# Patient Record
Sex: Female | Born: 1965 | Race: White | Hispanic: No | Smoking: Never smoker
Health system: Southern US, Community
[De-identification: ages and names within clinical notes are randomized; demographics above are authoritative.]

## PROBLEM LIST (undated history)

## (undated) DIAGNOSIS — G2581 Restless legs syndrome: Secondary | ICD-10-CM

## (undated) HISTORY — PX: ABDOMINAL HYSTERECTOMY: SHX81

## (undated) HISTORY — PX: APPENDECTOMY: SHX54

---

## 2008-11-24 ENCOUNTER — Emergency Department (HOSPITAL_BASED_OUTPATIENT_CLINIC_OR_DEPARTMENT_OTHER): Admission: EM | Admit: 2008-11-24 | Discharge: 2008-11-25 | Payer: Self-pay | Admitting: Emergency Medicine

## 2010-02-03 ENCOUNTER — Emergency Department (HOSPITAL_BASED_OUTPATIENT_CLINIC_OR_DEPARTMENT_OTHER): Admission: EM | Admit: 2010-02-03 | Discharge: 2010-02-03 | Payer: Self-pay | Admitting: Emergency Medicine

## 2010-02-03 ENCOUNTER — Ambulatory Visit: Payer: Self-pay | Admitting: Diagnostic Radiology

## 2011-02-12 LAB — CBC
HCT: 29.4 % — ABNORMAL LOW (ref 36.0–46.0)
MCHC: 30.5 g/dL (ref 30.0–36.0)
RBC: 4.4 MIL/uL (ref 3.87–5.11)

## 2011-02-12 LAB — PREGNANCY, URINE: Preg Test, Ur: NEGATIVE

## 2011-02-12 LAB — GC/CHLAMYDIA PROBE AMP, GENITAL: Chlamydia, DNA Probe: NEGATIVE

## 2011-02-12 LAB — WET PREP, GENITAL
Clue Cells Wet Prep HPF POC: NONE SEEN
WBC, Wet Prep HPF POC: NONE SEEN

## 2011-02-12 LAB — RPR: RPR Ser Ql: NONREACTIVE

## 2011-10-06 ENCOUNTER — Encounter: Payer: Self-pay | Admitting: *Deleted

## 2011-10-06 ENCOUNTER — Other Ambulatory Visit: Payer: Self-pay

## 2011-10-06 ENCOUNTER — Emergency Department (INDEPENDENT_AMBULATORY_CARE_PROVIDER_SITE_OTHER): Payer: Federal, State, Local not specified - PPO

## 2011-10-06 ENCOUNTER — Emergency Department (HOSPITAL_BASED_OUTPATIENT_CLINIC_OR_DEPARTMENT_OTHER)
Admission: EM | Admit: 2011-10-06 | Discharge: 2011-10-06 | Disposition: A | Discharge status: Home / Self Care | Payer: Federal, State, Local not specified - PPO | Attending: Emergency Medicine | Admitting: Emergency Medicine

## 2011-10-06 DIAGNOSIS — R5381 Other malaise: Secondary | ICD-10-CM | POA: Insufficient documentation

## 2011-10-06 DIAGNOSIS — R4789 Other speech disturbances: Secondary | ICD-10-CM

## 2011-10-06 DIAGNOSIS — R4701 Aphasia: Secondary | ICD-10-CM | POA: Insufficient documentation

## 2011-10-06 DIAGNOSIS — F17200 Nicotine dependence, unspecified, uncomplicated: Secondary | ICD-10-CM | POA: Insufficient documentation

## 2011-10-06 DIAGNOSIS — I10 Essential (primary) hypertension: Secondary | ICD-10-CM | POA: Insufficient documentation

## 2011-10-06 DIAGNOSIS — R531 Weakness: Secondary | ICD-10-CM

## 2011-10-06 DIAGNOSIS — N390 Urinary tract infection, site not specified: Secondary | ICD-10-CM | POA: Insufficient documentation

## 2011-10-06 HISTORY — DX: Restless legs syndrome: G25.81

## 2011-10-06 LAB — URINALYSIS, ROUTINE W REFLEX MICROSCOPIC
Bilirubin Urine: NEGATIVE
Nitrite: POSITIVE — AB
Specific Gravity, Urine: 1.019 (ref 1.005–1.030)
Urobilinogen, UA: 1 mg/dL (ref 0.0–1.0)

## 2011-10-06 LAB — CK: Total CK: 80 U/L (ref 7–177)

## 2011-10-06 LAB — BASIC METABOLIC PANEL
BUN: 7 mg/dL (ref 6–23)
CO2: 28 mEq/L (ref 19–32)
Calcium: 9.1 mg/dL (ref 8.4–10.5)
Creatinine, Ser: 0.6 mg/dL (ref 0.50–1.10)
GFR calc non Af Amer: >90 mL/min (ref >90)

## 2011-10-06 LAB — CBC
HCT: 43.4 % (ref 36.0–46.0)
Hemoglobin: 14.7 g/dL (ref 12.0–15.0)
MCV: 83.5 fL (ref 78.0–100.0)
RBC: 5.2 MIL/uL — ABNORMAL HIGH (ref 3.87–5.11)
RDW: 13.2 % (ref 11.5–15.5)
WBC: 8 10*3/uL (ref 4.0–10.5)

## 2011-10-06 LAB — GLUCOSE, CAPILLARY: Glucose-Capillary: 97 mg/dL (ref 70–99)

## 2011-10-06 LAB — URINE MICROSCOPIC-ADD ON

## 2011-10-06 LAB — PREGNANCY, URINE: Preg Test, Ur: NEGATIVE

## 2011-10-06 MED ORDER — SULFAMETHOXAZOLE-TRIMETHOPRIM 800-160 MG PO TABS: 1.0000 | ORAL_TABLET | Freq: Two times a day (BID) | ORAL | Status: AC

## 2011-10-06 NOTE — ED Provider Notes (Addendum)
History  This chart was scribed for No att. providers found by Bennett Scrape. This patient was seen in room MH08/MH08 and the patient's care was started at 3:14PM.  CSN: [id number] Arrival date & time: 10/06/2011  1:28 PM   First MD Initiated Contact with Patient 10/06/11 1509      Chief Complaint  Patient presents with  . Aphasia  . Weakness     The history is provided by the patient. No language interpreter was used.    Annette Brewer is a 45 y.o. female who presents to the Emergency Department complaining of one day of slurred speech and disorientation with associated difficulty in ambulating and generalized weakness bilaterally. Pt states that she takes Mirapex for restless leg syndrome, but did not take her medication for the past 3 days. Pt states that she got up yesterday and felt light-headed and disoriented. Pt states that she was "feeling foggy" was confused, and had speech slurred. Pt reports that she took one of mother's Mirapex pills last night and then went to bed. Pt states that she feels back to normal today, but came to this ED to be checked out.  Pt denies headache,fevers, chills, neck pain, neck stiffness, SOB, dysuria, hematuria, nausea, vomiting, diarrhea, abdominal pain as associated symptoms. Pt states that she has no previous episodes. Pt denies taking any other medications or having any other medical problems. Pt reports no h/o strokes. Pt states that she smoke marijuana on occasion but not yesterday. Pt states that she drinks alcohol socially. chronic urgency  ED Notes, ED Provider Notes from 10/06/11 0000 to 10/06/11 13:24:18       Trula Ore, RN 10/06/2011 13:22      Pt presents to ED today with slurred speech and disorienteation. Pt was seen in PMD and advised to come to ER for further eval. Pt ambulating with some difficulty and reports generalized weakness bilaterally    Past Medical History  Diagnosis Date  . Restless leg syndrome     History  reviewed. No pertinent past surgical history.  No family history on file.  History  Substance Use Topics  . Smoking status: Current Everyday Smoker -- 1.0 packs/day  . Smokeless tobacco: Not on file  . Alcohol Use: Yes, socially    OB History    Grav Para Term Preterm Abortions TAB SAB Ect Mult Living                  Review of Systems A complete 10 system review of systems was obtained and is otherwise negative except as noted in the HPI.   Allergies  Review of patient's allergies indicates no known allergies.  Home Medications   Current Outpatient Rx  Name Route Sig Dispense Refill  . PRAMIPEXOLE DIHYDROCHLORIDE 1 MG PO TABS Oral Take 1 mg by mouth 3 (three) times daily.      . SULFAMETHOXAZOLE-TRIMETHOPRIM 800-160 MG PO TABS Oral Take 1 tablet by mouth 2 (two) times daily. 6 tablet 0    Triage Vitals: BP 175/94  Pulse 72  Temp(Src) 97.6 F (36.4 C) (Oral)  Resp 20  Ht 5\' 6"  (1.676 m)  Wt 207 lb (93.895 kg)  BMI 33.41 kg/m2  SpO2 99%  Physical Exam  Nursing note and vitals reviewed. Constitutional: She is oriented to person, place, and time. She appears well-developed and well-nourished.  HENT:  Head: Normocephalic and atraumatic.  Mouth/Throat: Oropharynx is clear and moist.  Eyes: EOM are normal. Pupils are equal, round, and reactive to  light.  Neck: Normal range of motion. Neck supple.  Cardiovascular: Normal rate, regular rhythm and normal heart sounds.  Exam reveals no gallop and no friction rub.   No murmur heard. Pulmonary/Chest: Effort normal and breath sounds normal.       Lungs clear to ascultation   Abdominal: Soft. There is no rebound and no guarding.       No CVA tenderness  Musculoskeletal: Normal range of motion. She exhibits no edema.       Strength 5 out of 5 in all extremities and sensation is intact.  Neurological: She is alert and oriented to person, place, and time. No cranial nerve deficit. She exhibits normal muscle tone. Coordination  normal.       Strength 5/5 all extremities No pronator drift No facial droop   Skin: Skin is warm and dry.  Psychiatric: She has a normal mood and affect.    ED Course  Procedures (including critical care time)  DIAGNOSTIC STUDIES: Oxygen Saturation is 99% on room air, normal by my interpretation.    COORDINATION OF CARE: 3:23PM-Discussed treatment plan with pt at bedside and pt agreed to plan.   Labs Reviewed  CBC - Abnormal; Notable for the following:    RBC 5.20 (*)    All other components within normal limits  BASIC METABOLIC PANEL - Abnormal; Notable for the following:    Glucose, Bld 102 (*)    All other components within normal limits  URINALYSIS, ROUTINE W REFLEX MICROSCOPIC - Abnormal; Notable for the following:    APPearance CLOUDY (*)    Nitrite POSITIVE (*)    Leukocytes, UA MODERATE (*)    All other components within normal limits  URINE MICROSCOPIC-ADD ON - Abnormal; Notable for the following:    Bacteria, UA MANY (*)    All other components within normal limits  GLUCOSE, CAPILLARY  TROPONIN I  CK  PREGNANCY, URINE  URINE CULTURE   Ct Head Wo Contrast  10/06/2011  *RADIOLOGY REPORT*  Clinical Data: Aphasia.  Slurred speech.  Disorientation.  CT HEAD WITHOUT CONTRAST  Technique:  Contiguous axial images were obtained from the base of the skull through the vertex without contrast.  Comparison: None.  Findings: No mass effect, midline shift, or acute intracranial hemorrhage.  Mastoid air cells and visualized paranasal sinuses are clear.  Cranium intact.  Ventricles system and extraaxial space are unremarkable.  No focal hypodensities to suggest acute ischemia.  IMPRESSION: Negative head CT.  Original Report Authenticated By: Donavan Burnet, M.D.     1. Weakness   2. UTI (urinary tract infection)   3. High blood pressure     Date: 10/07/2011  Rate: 67  Rhythm: normal sinus rhythm  QRS Axis: normal  Intervals: normal  ST/T Wave abnormalities: normal   Conduction Disutrbances:none  Narrative Interpretation:   Old EKG Reviewed: none available   MDM  The patient has nonspecific sx yesterday which have resolved. Do not suspect CVA/TIA. CT head ordered prior to my assessment was unremarkable. The sx occurred in setting of lack of sleep. She now feels well rested and back to baseline. Does have urinary frequency, UTI by UA. Will treat. PMD f/u. Precautions for return.  BP 166/97  Pulse 74  Temp(Src) 98.2 F (36.8 C) (Oral)  Resp 20  Ht 5\' 6"  (1.676 m)  Wt 207 lb (93.895 kg)  BMI 33.41 kg/m2  SpO2 100%   I personally performed the services described in this documentation, which was scribed in my  presence. The recorded information has been reviewed and considered.    Forbes Cellar, MD 10/07/11 1610  Forbes Cellar, MD 10/07/11 [id number]

## 2011-10-06 NOTE — ED Notes (Signed)
Pt presents to ED today with slurred speech and disorienteation.  Pt was seen in PMD and advised to come to ER for further eval.  Pt ambulating with some difficulty and reports generalized weakness bilaterally

## 2011-10-06 NOTE — ED Notes (Signed)
Pt states that she had slurred speech yesterday, trouble walking and was confused as to what day it was. Worried she had a stroke, so she saw her Dr. Today. Sent here for evaluation and ?CT/MRI. Presents with no neurological deficits at this time.

## 2011-10-08 LAB — URINE CULTURE: Colony Count: >=100000

## 2011-10-09 NOTE — ED Notes (Signed)
+  urine Patient treated with Septra-sensitive to same-chart appended per protocol MD. 

## 2012-09-11 IMAGING — CT CT HEAD W/O CM
1 series · 16 of 30 positions shown, 20 images · non-contrast
Comparison: None.

CLINICAL DATA: Aphasia.  Slurred speech.  Disorientation.

CT HEAD WITHOUT CONTRAST
TECHNIQUE: Contiguous axial images were obtained from the base of
the skull through the vertex without contrast.

[Series 2: head 4.8 h37s · axial · 0.45mm/px · z∈[+1187,+1323]mm · 16 of 32 slices shown, 20 images]
[im 2/32  brain]
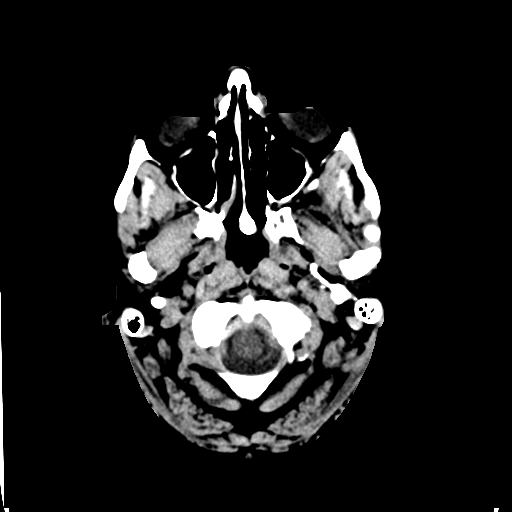
[im 2/32  bone]
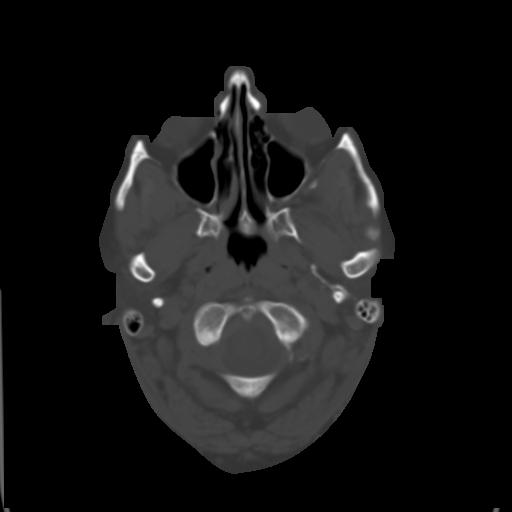
[im 4/32  brain]
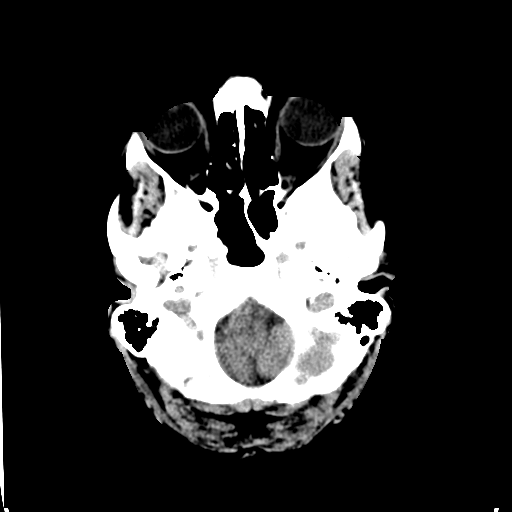
[im 6/32  brain]
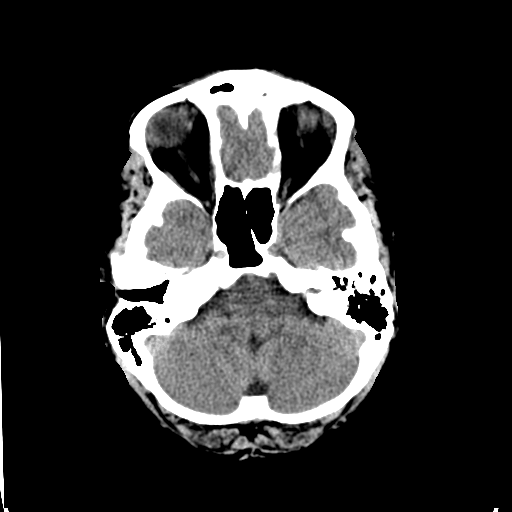
[im 8/32  brain]
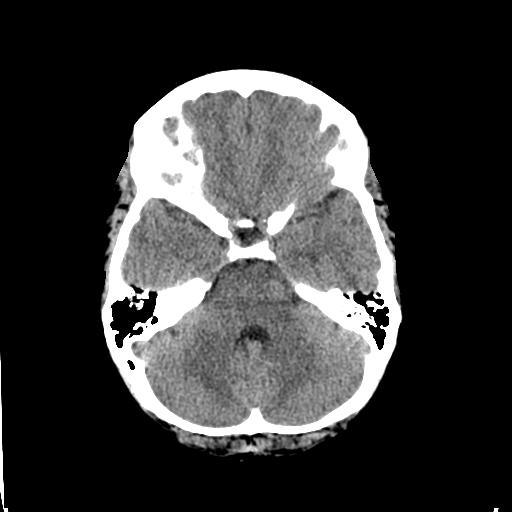
[im 9/32  brain]
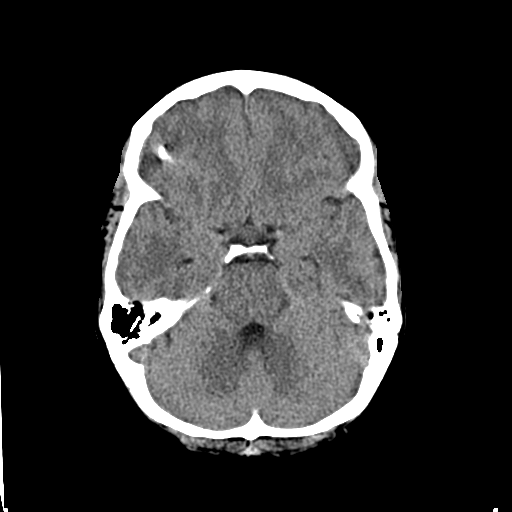
[im 9/32  bone]
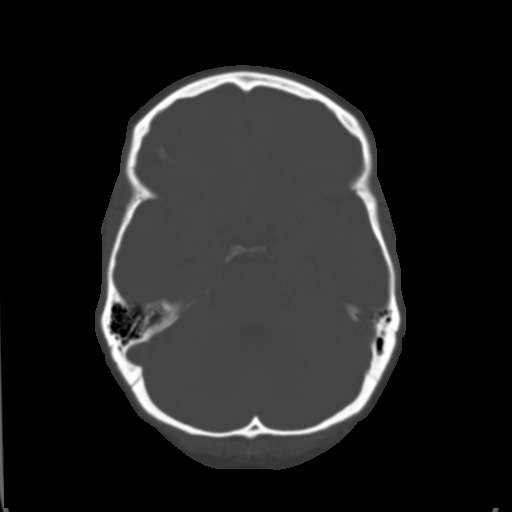
[im 11/32  brain]
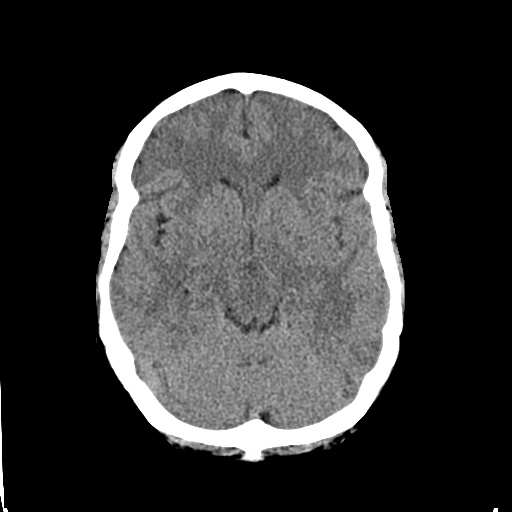
[im 13/32  brain]
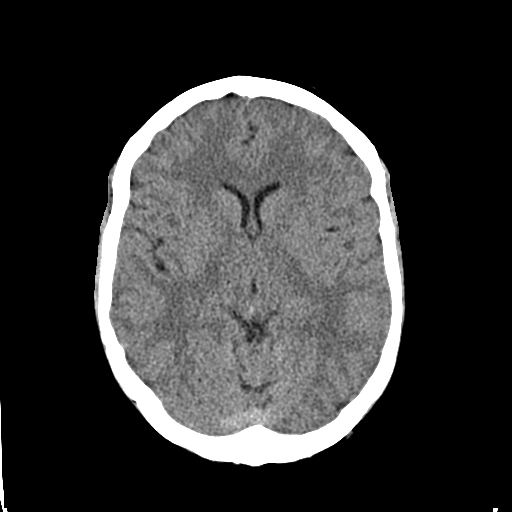
[im 15/32  brain]
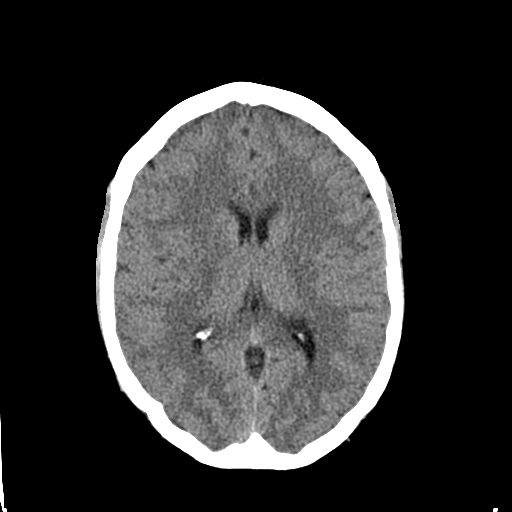
[im 17/32  brain]
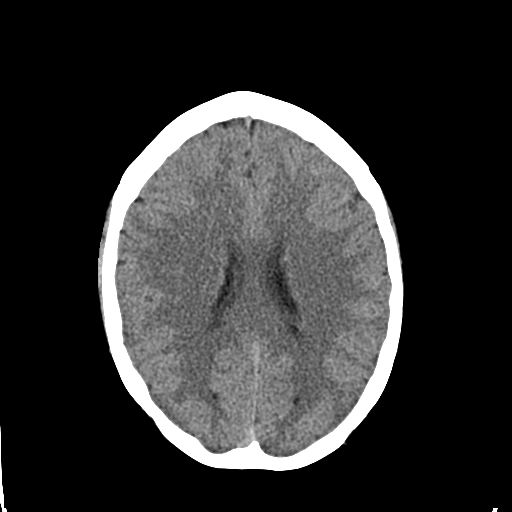
[im 17/32  bone]
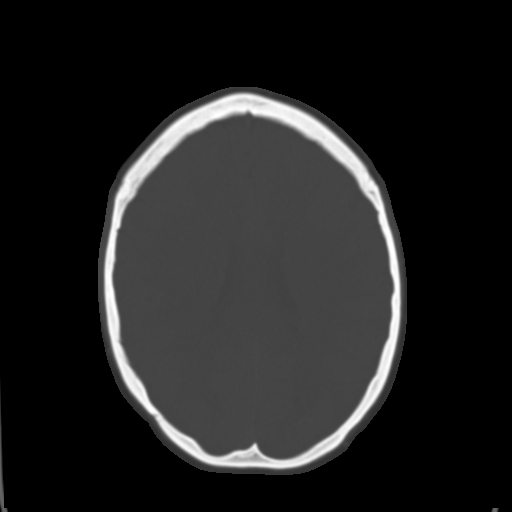
[im 19/32  brain]
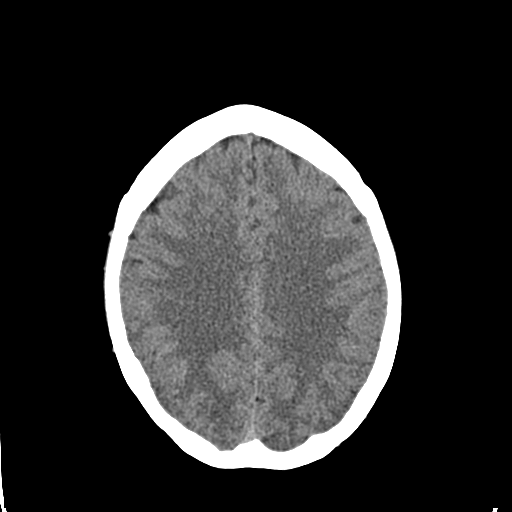
[im 21/32  brain]
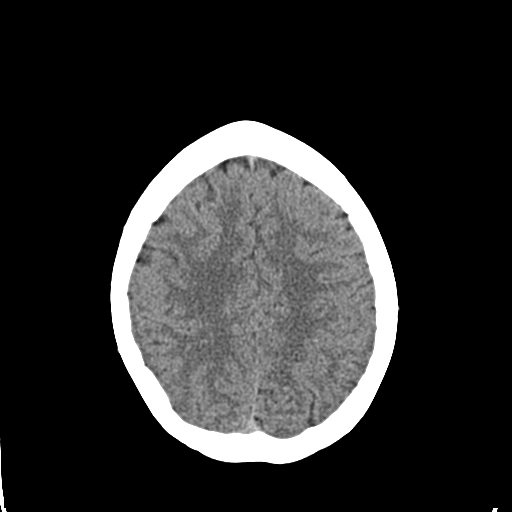
[im 23/32  brain]
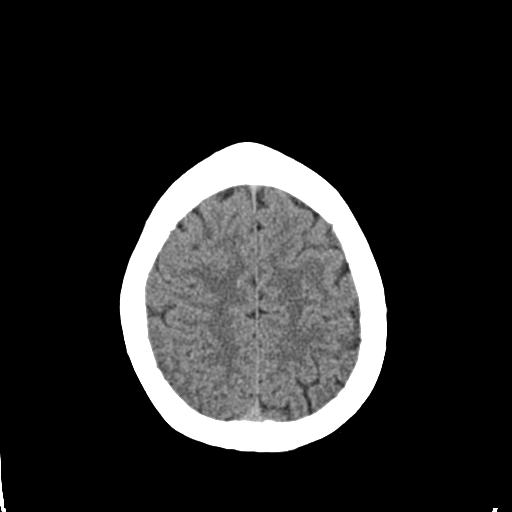
[im 24/32  brain]
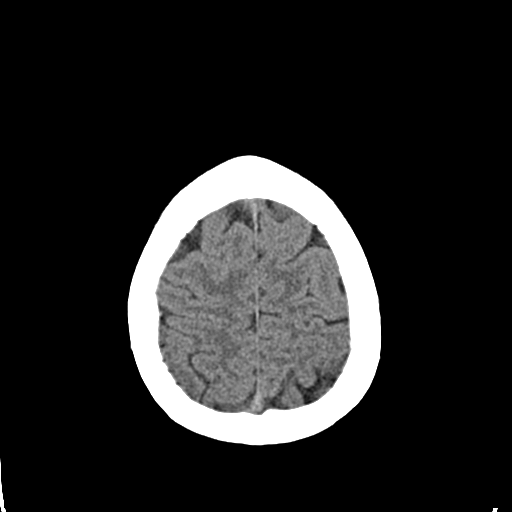
[im 24/32  bone]
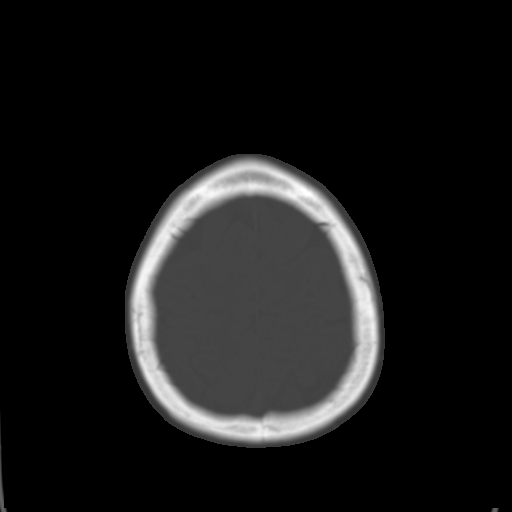
[im 26/32  brain]
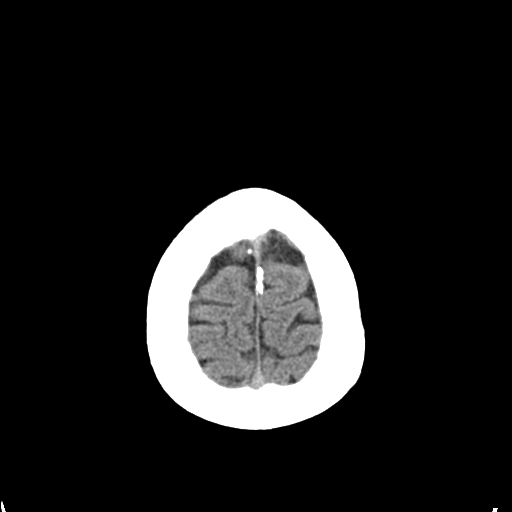
[im 28/32  brain]
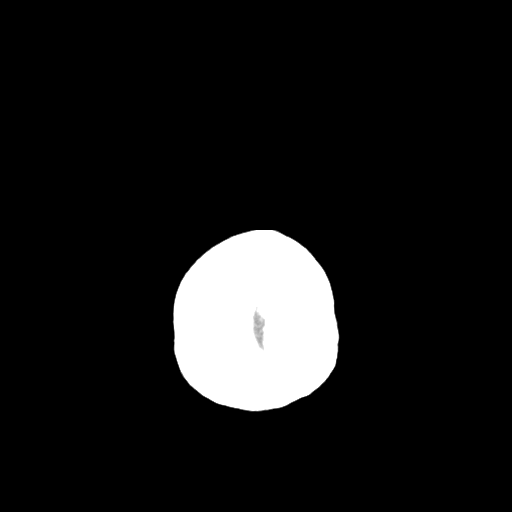
[im 30/32  brain]
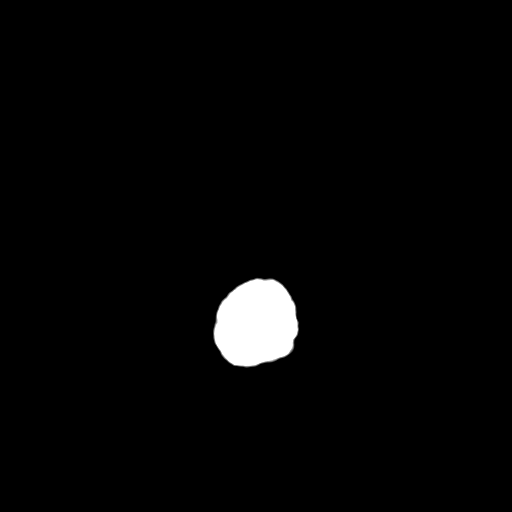

[16 of 30 positions shown; findings below may reference images not displayed]

FINDINGS: No mass effect, midline shift, or acute intracranial
hemorrhage.  Mastoid air cells and visualized paranasal sinuses are
clear.  Cranium intact.  Ventricles system and extraaxial space are
unremarkable.  No focal hypodensities to suggest acute ischemia.
IMPRESSION: Negative head CT.

## 2015-05-01 ENCOUNTER — Encounter (HOSPITAL_BASED_OUTPATIENT_CLINIC_OR_DEPARTMENT_OTHER): Payer: Self-pay | Admitting: *Deleted

## 2015-05-01 ENCOUNTER — Emergency Department (HOSPITAL_BASED_OUTPATIENT_CLINIC_OR_DEPARTMENT_OTHER): Payer: Federal, State, Local not specified - PPO

## 2015-05-01 ENCOUNTER — Emergency Department (HOSPITAL_BASED_OUTPATIENT_CLINIC_OR_DEPARTMENT_OTHER)
Admission: EM | Admit: 2015-05-01 | Discharge: 2015-05-01 | Disposition: A | Discharge status: Home / Self Care | Payer: Federal, State, Local not specified - PPO | Attending: Emergency Medicine | Admitting: Emergency Medicine

## 2015-05-01 DIAGNOSIS — Z79899 Other long term (current) drug therapy: Secondary | ICD-10-CM | POA: Insufficient documentation

## 2015-05-01 DIAGNOSIS — Y9289 Other specified places as the place of occurrence of the external cause: Secondary | ICD-10-CM | POA: Insufficient documentation

## 2015-05-01 DIAGNOSIS — W1849XA Other slipping, tripping and stumbling without falling, initial encounter: Secondary | ICD-10-CM | POA: Insufficient documentation

## 2015-05-01 DIAGNOSIS — Y990 Civilian activity done for income or pay: Secondary | ICD-10-CM | POA: Insufficient documentation

## 2015-05-01 DIAGNOSIS — G2581 Restless legs syndrome: Secondary | ICD-10-CM | POA: Insufficient documentation

## 2015-05-01 DIAGNOSIS — S99922A Unspecified injury of left foot, initial encounter: Secondary | ICD-10-CM | POA: Diagnosis present

## 2015-05-01 DIAGNOSIS — Y9389 Activity, other specified: Secondary | ICD-10-CM | POA: Insufficient documentation

## 2015-05-01 DIAGNOSIS — S93402A Sprain of unspecified ligament of left ankle, initial encounter: Secondary | ICD-10-CM | POA: Diagnosis not present

## 2015-05-01 NOTE — ED Notes (Signed)
Patient transported to X-ray via stretcher with tech. 

## 2015-05-01 NOTE — ED Notes (Signed)
PA at bedside to discuss results of testing. 

## 2015-05-01 NOTE — Discharge Instructions (Signed)

## 2015-05-01 NOTE — ED Notes (Signed)
PA at bedside.

## 2015-05-01 NOTE — ED Notes (Signed)
Patient advised she was her for a worker's comp case. Patient advised she works for the [employer] and does not require a Urine Drug Screen for their worker's comp.

## 2015-05-01 NOTE — ED Provider Notes (Signed)
CSN: [id number]     Arrival date & time 05/01/15  1308 History   First MD Initiated Contact with Patient 05/01/15 1325     Chief Complaint  Patient presents with  . Foot Injury     (Consider location/radiation/quality/duration/timing/severity/associated sxs/prior Treatment) Patient is a 49 y.o. female presenting with foot injury. The history is provided by the patient. No language interpreter was used.  Foot Injury Location:  Foot and ankle Time since incident:  2 hours Injury: no   Ankle location:  L ankle Foot location:  L foot Pain details:    Quality:  Aching   Radiates to:  Does not radiate   Severity:  Moderate   Onset quality:  Gradual   Duration:  1 day   Timing:  Constant   Progression:  Worsening Chronicity:  New Dislocation: no   Foreign body present:  No foreign bodies Tetanus status:  Up to date Prior injury to area:  No Worsened by:  Nothing tried Ineffective treatments:  None tried Associated symptoms: no decreased ROM   Risk factors: no concern for non-accidental trauma     Past Medical History  Diagnosis Date  . Restless leg syndrome    Past Surgical History  Procedure Laterality Date  . Appendectomy    . Abdominal hysterectomy     History reviewed. No pertinent family history. History  Substance Use Topics  . Smoking status: Never Smoker   . Smokeless tobacco: Not on file  . Alcohol Use: No   OB History    No data available     Review of Systems  Musculoskeletal: Negative for joint swelling.  All other systems reviewed and are negative.     Allergies  Review of patient's allergies indicates no known allergies.  Home Medications   Prior to Admission medications   Medication Sig Start Date End Date Taking? Authorizing Provider  lisinopril (PRINIVIL,ZESTRIL) 40 MG tablet Take 40 mg by mouth daily.   Yes Historical Provider, MD  pramipexole (MIRAPEX) 1 MG tablet Take 1 mg by mouth 3 (three) times daily.      Historical Provider, MD    BP 138/91 mmHg  Pulse 75  Temp(Src) 98.2 F (36.8 C) (Oral)  Resp 20  Ht 5\' 6"  (1.676 m)  Wt 265 lb (120.203 kg)  BMI 42.79 kg/m2  SpO2 100% Physical Exam  Constitutional: She appears well-developed and well-nourished.  HENT:  Head: Normocephalic.  Musculoskeletal: Normal range of motion. She exhibits tenderness.  Tender swollen ankle from nv and ns intact  Neurological: She is alert. She has normal reflexes.  Skin: Skin is warm.  Psychiatric: She has a normal mood and affect.  Nursing note and vitals reviewed.   ED Course  Procedures (including critical care time) Labs Review Labs Reviewed - No data to display  Imaging Review Dg Ankle Complete Left  05/01/2015   CLINICAL DATA:  LEFT foot pain across first to third metatarsals radiating to LEFT ankle after tripping and falling at work 2 hr ago  EXAM: LEFT ANKLE COMPLETE - 3+ VIEW  COMPARISON:  None  FINDINGS: Soft tissue swelling diffusely, mild.  Joint spaces preserved.  Osseous mineralization grossly normal for technique.  No acute fracture, dislocation, or bone destruction.  Large plantar calcaneal spur.  IMPRESSION: Large plantar calcaneal spur.  No acute osseous findings.   Electronically Signed   By: Ulyses SouthwardMark  Boles M.D.   On: 05/01/2015 13:43   Dg Foot Complete Left  05/01/2015   CLINICAL DATA:  Left  foot pain across the first through third metatarsals after trauma 2 hours ago. Initial encounter.  EXAM: LEFT FOOT - COMPLETE 3+ VIEW  COMPARISON:  Ankle films of same date  FINDINGS: Small calcaneal spur. No acute fracture or dislocation. Mild midfoot osteoarthritis.  IMPRESSION: No acute osseous abnormality.   Electronically Signed   By: Jeronimo Greaves M.D.   On: 05/01/2015 13:43     EKG Interpretation None      MDM   Final diagnoses:  Ankle sprain, left, initial encounter    aso Crutches  Follow up with Dr. Pearletha Forge for evaluation if pain persist.   Elson Areas, PA-C 05/01/15 [address] [place],  PA-C 05/01/15 1414  Doug Sou, MD 05/01/15 1432

## 2015-05-01 NOTE — ED Notes (Signed)
Ambulatory.  Reports tripping at work and hurting her left foot today.  No bruising, swelling noted.

## 2016-04-06 IMAGING — DX DG FOOT COMPLETE 3+V*L*
3 series · 3 of 3 positions shown · non-contrast
Comparison: Ankle films of same date

CLINICAL DATA: Left foot pain across the first through third
metatarsals after trauma 2 hours ago. Initial encounter.

EXAM:
LEFT FOOT - COMPLETE 3+ VIEW

[foot ap]
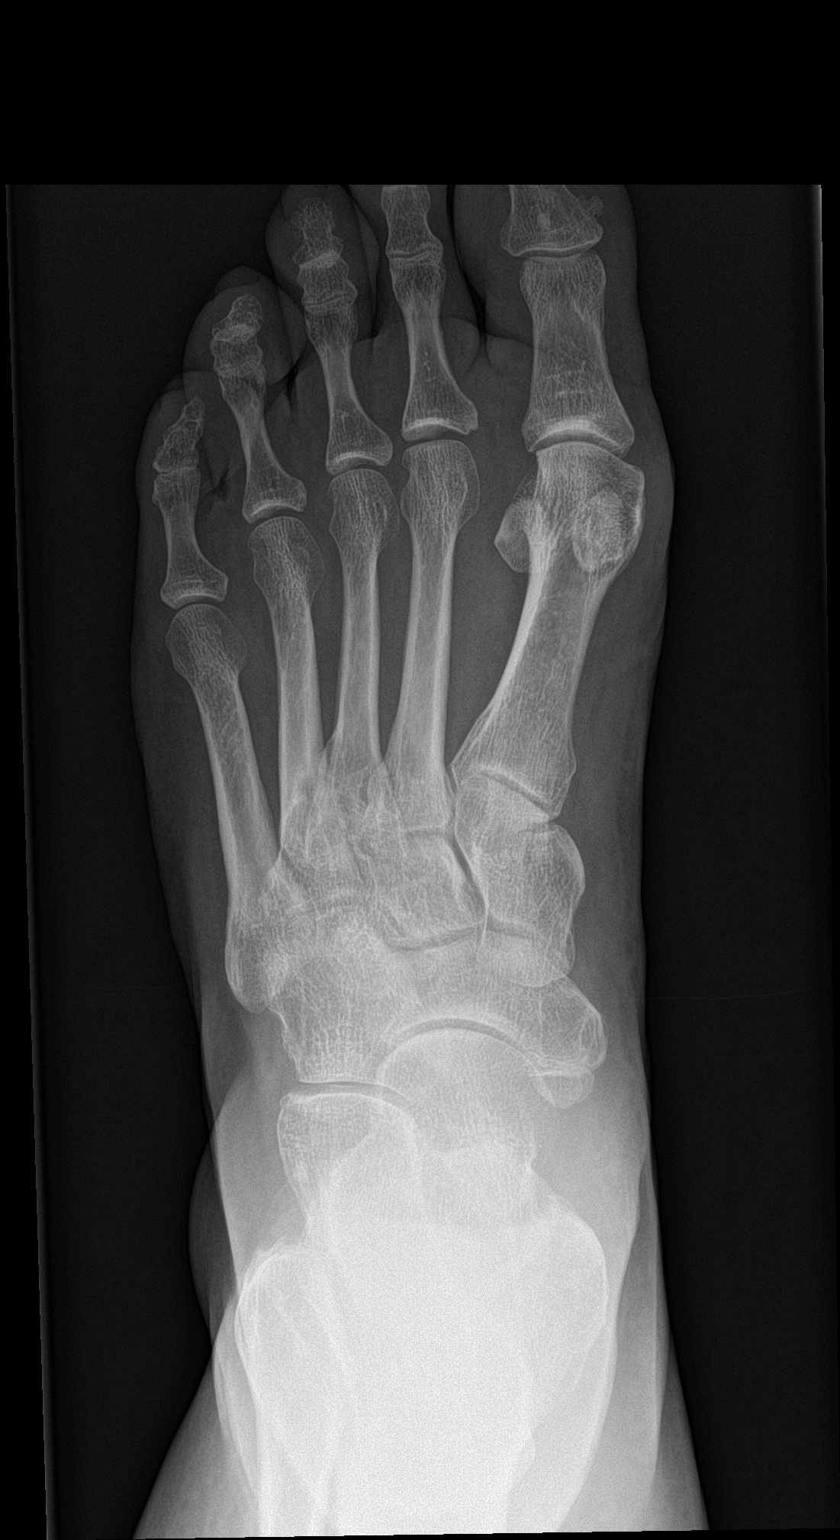

[foot obl]
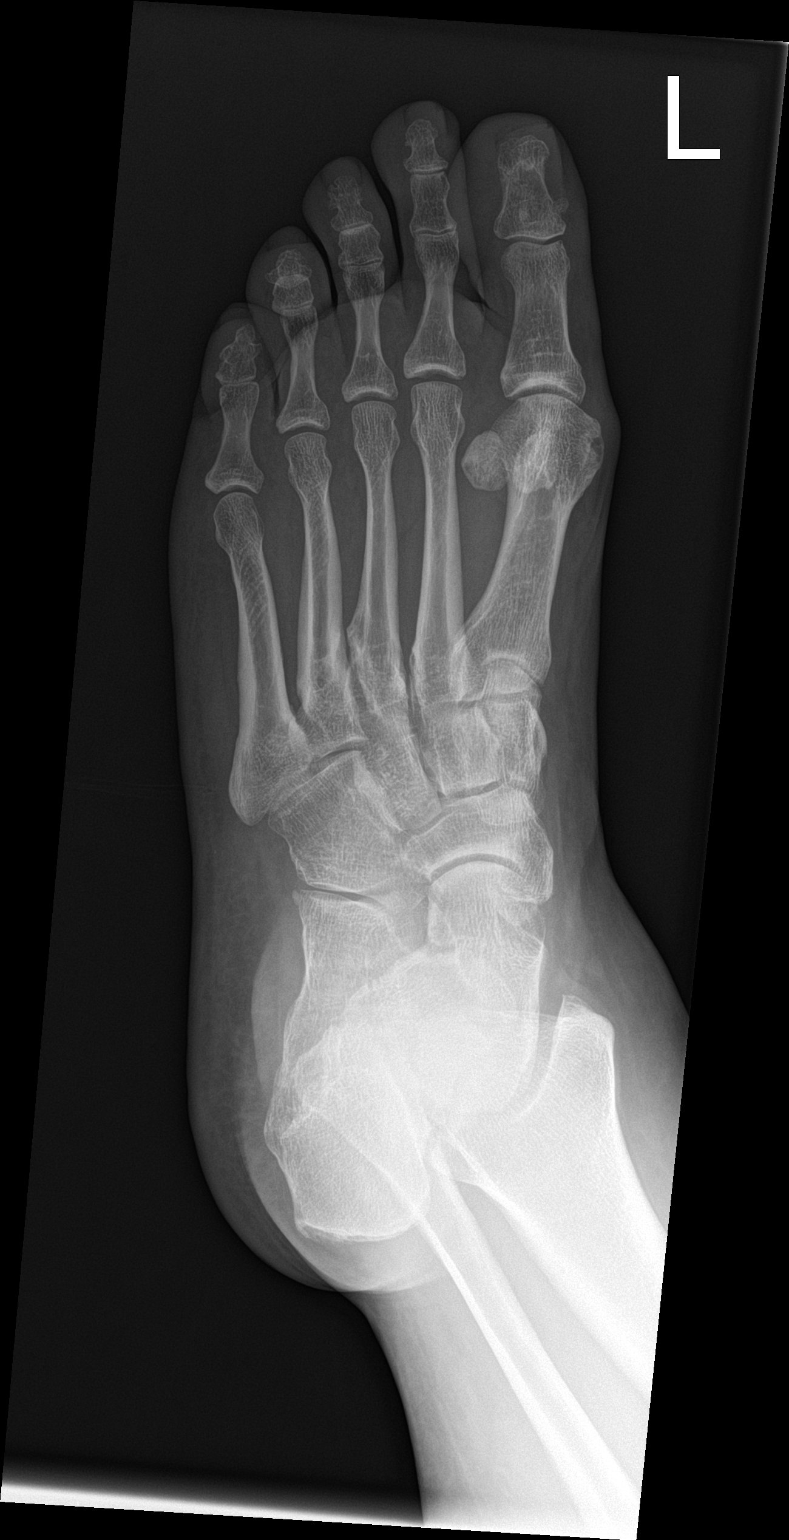

[foot lat]
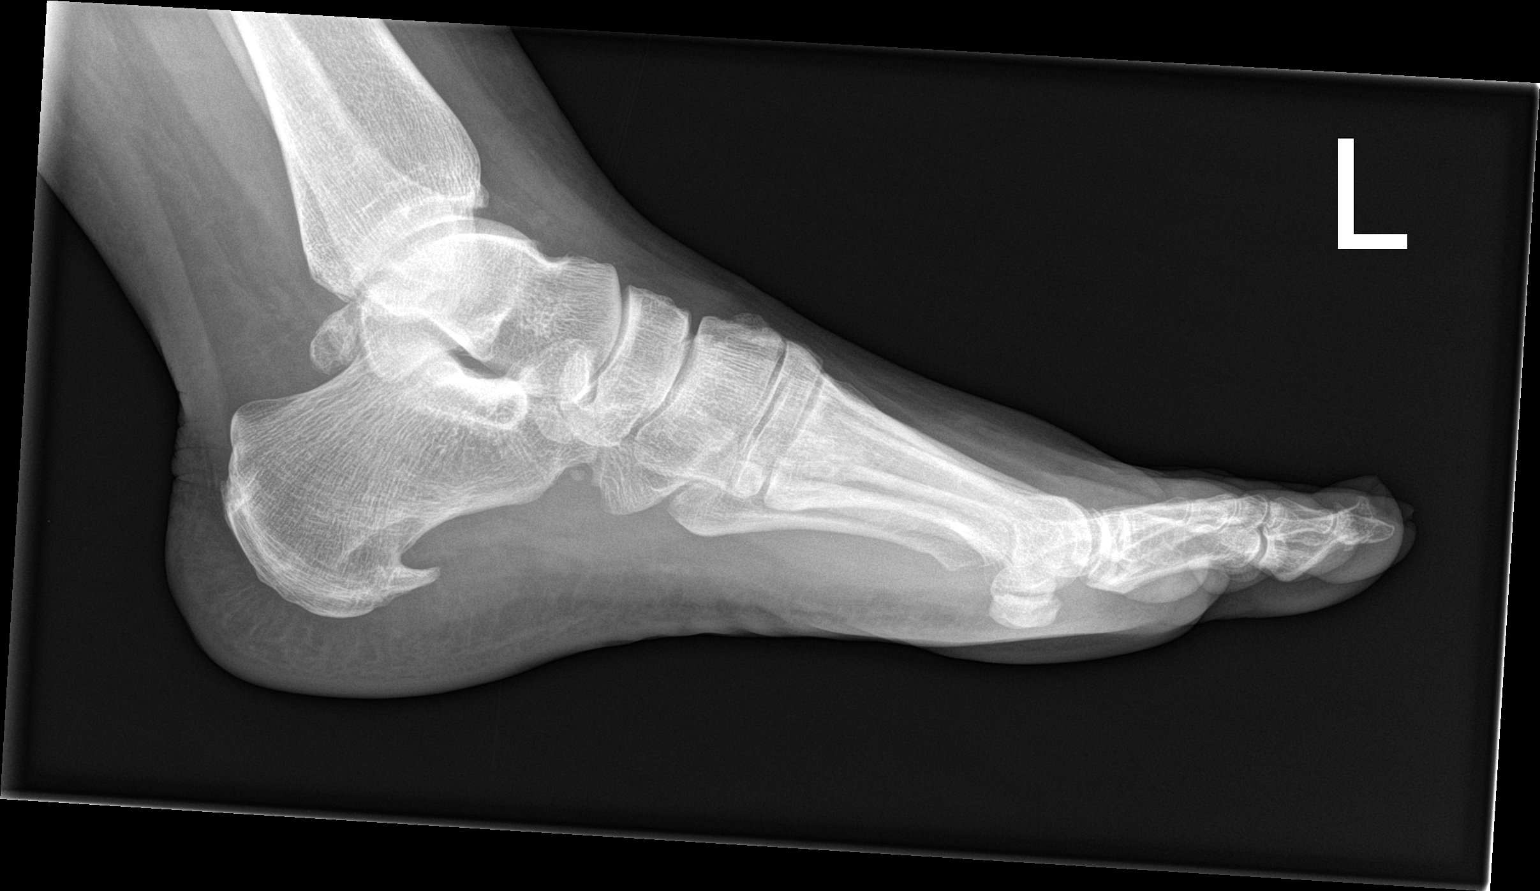

[3 of 3 positions shown; findings below may reference images not displayed]

FINDINGS: Small calcaneal spur. No acute fracture or dislocation. Mild midfoot
osteoarthritis.
IMPRESSION: No acute osseous abnormality.
